# Patient Record
Sex: Male | Born: 1990 | Race: Black or African American | Hispanic: No | Marital: Single | Smoking: Current every day smoker
Health system: Southern US, Community
[De-identification: ages and names within clinical notes are randomized; demographics above are authoritative.]

---
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
## 2019-08-30 ENCOUNTER — Emergency Department (HOSPITAL_BASED_OUTPATIENT_CLINIC_OR_DEPARTMENT_OTHER): Payer: Self-pay

## 2019-08-30 ENCOUNTER — Encounter (HOSPITAL_BASED_OUTPATIENT_CLINIC_OR_DEPARTMENT_OTHER): Payer: Self-pay

## 2019-08-30 ENCOUNTER — Emergency Department (HOSPITAL_BASED_OUTPATIENT_CLINIC_OR_DEPARTMENT_OTHER)
Admission: EM | Admit: 2019-08-30 | Discharge: 2019-08-30 | Disposition: A | Discharge status: Home / Self Care | Payer: Self-pay | Attending: Emergency Medicine | Admitting: Emergency Medicine

## 2019-08-30 ENCOUNTER — Other Ambulatory Visit: Payer: Self-pay

## 2019-08-30 DIAGNOSIS — R509 Fever, unspecified: Secondary | ICD-10-CM | POA: Insufficient documentation

## 2019-08-30 DIAGNOSIS — F17210 Nicotine dependence, cigarettes, uncomplicated: Secondary | ICD-10-CM | POA: Insufficient documentation

## 2019-08-30 DIAGNOSIS — Z20822 Contact with and (suspected) exposure to covid-19: Secondary | ICD-10-CM | POA: Insufficient documentation

## 2019-08-30 DIAGNOSIS — R1030 Lower abdominal pain, unspecified: Secondary | ICD-10-CM | POA: Insufficient documentation

## 2019-08-30 DIAGNOSIS — R112 Nausea with vomiting, unspecified: Secondary | ICD-10-CM | POA: Insufficient documentation

## 2019-08-30 DIAGNOSIS — R197 Diarrhea, unspecified: Secondary | ICD-10-CM | POA: Insufficient documentation

## 2019-08-30 DIAGNOSIS — R05 Cough: Secondary | ICD-10-CM | POA: Insufficient documentation

## 2019-08-30 LAB — CBC WITH DIFFERENTIAL/PLATELET
Abs Immature Granulocytes: 0.07 10*3/uL (ref 0.00–0.07)
Basophils Absolute: 0 10*3/uL (ref 0.0–0.1)
Basophils Relative: 0 %
Eosinophils Absolute: 0.1 10*3/uL (ref 0.0–0.5)
Eosinophils Relative: 1 %
HCT: 51.2 % (ref 39.0–52.0)
Hemoglobin: 16.5 g/dL (ref 13.0–17.0)
Immature Granulocytes: 1 %
Lymphocytes Relative: 16 %
Lymphs Abs: 1.9 10*3/uL (ref 0.7–4.0)
MCH: 29.5 pg (ref 26.0–34.0)
MCHC: 32.2 g/dL (ref 30.0–36.0)
MCV: 91.6 fL (ref 80.0–100.0)
Monocytes Absolute: 0.5 10*3/uL (ref 0.1–1.0)
Monocytes Relative: 4 %
Neutro Abs: 9.5 10*3/uL — ABNORMAL HIGH (ref 1.7–7.7)
Neutrophils Relative %: 78 %
Platelets: 308 10*3/uL (ref 150–400)
RBC: 5.59 MIL/uL (ref 4.22–5.81)
RDW: 14.5 % (ref 11.5–15.5)
WBC: 12.2 10*3/uL — ABNORMAL HIGH (ref 4.0–10.5)
nRBC: 0 % (ref 0.0–0.2)

## 2019-08-30 LAB — URINALYSIS, ROUTINE W REFLEX MICROSCOPIC
Bilirubin Urine: NEGATIVE
Glucose, UA: NEGATIVE mg/dL
Hgb urine dipstick: NEGATIVE
Ketones, ur: NEGATIVE mg/dL
Leukocytes,Ua: NEGATIVE
Nitrite: NEGATIVE
Protein, ur: NEGATIVE mg/dL
Specific Gravity, Urine: 1.01 (ref 1.005–1.030)
pH: 6.5 (ref 5.0–8.0)

## 2019-08-30 LAB — COMPREHENSIVE METABOLIC PANEL
ALT: 25 U/L (ref 0–44)
AST: 27 U/L (ref 15–41)
Albumin: 4.7 g/dL (ref 3.5–5.0)
Alkaline Phosphatase: 88 U/L (ref 38–126)
Anion gap: 12 (ref 5–15)
BUN: 11 mg/dL (ref 6–20)
CO2: 24 mmol/L (ref 22–32)
Calcium: 9.9 mg/dL (ref 8.9–10.3)
Chloride: 102 mmol/L (ref 98–111)
Creatinine, Ser: 1.04 mg/dL (ref 0.61–1.24)
GFR calc Af Amer: >60 mL/min (ref >60)
GFR calc non Af Amer: >60 mL/min (ref >60)
Glucose, Bld: 93 mg/dL (ref 70–99)
Potassium: 3.8 mmol/L (ref 3.5–5.1)
Sodium: 138 mmol/L (ref 135–145)
Total Bilirubin: 0.5 mg/dL (ref 0.3–1.2)
Total Protein: 7.9 g/dL (ref 6.5–8.1)

## 2019-08-30 LAB — RAPID URINE DRUG SCREEN, HOSP PERFORMED
Amphetamines: NOT DETECTED
Barbiturates: NOT DETECTED
Benzodiazepines: NOT DETECTED
Cocaine: POSITIVE — AB
Opiates: NOT DETECTED
Tetrahydrocannabinol: POSITIVE — AB

## 2019-08-30 LAB — SARS CORONAVIRUS 2 BY RT PCR (HOSPITAL ORDER, PERFORMED IN ~~LOC~~ HOSPITAL LAB): SARS Coronavirus 2: NEGATIVE

## 2019-08-30 LAB — LIPASE, BLOOD: Lipase: 30 U/L (ref 11–51)

## 2019-08-30 MED ORDER — KETOROLAC TROMETHAMINE 15 MG/ML IJ SOLN: 15.0000 mg | Freq: Once | INTRAMUSCULAR | Status: DC

## 2019-08-30 MED ORDER — PROMETHAZINE HCL 25 MG/ML IJ SOLN
INTRAMUSCULAR | Status: AC
  Administered 2019-08-30: 25 mg via INTRAVENOUS
  Filled 2019-08-30: qty 1

## 2019-08-30 MED ORDER — KETOROLAC TROMETHAMINE 30 MG/ML IJ SOLN
30.0000 mg | Freq: Once | INTRAMUSCULAR | Status: AC
  Administered 2019-08-30: 30 mg via INTRAVENOUS
  Filled 2019-08-30: qty 1

## 2019-08-30 MED ORDER — SODIUM CHLORIDE 0.9 % IV BOLUS
1000.0000 mL | Freq: Once | INTRAVENOUS | Status: AC
  Administered 2019-08-30: 1000 mL via INTRAVENOUS

## 2019-08-30 MED ORDER — ONDANSETRON 4 MG PO TBDP: 4.0000 mg | ORAL_TABLET | Freq: Three times a day (TID) | ORAL | 0 refills | Status: AC | PRN

## 2019-08-30 MED ORDER — HALOPERIDOL LACTATE 5 MG/ML IJ SOLN
2.0000 mg | Freq: Once | INTRAMUSCULAR | Status: AC
  Administered 2019-08-30: 2 mg via INTRAVENOUS
  Filled 2019-08-30: qty 1

## 2019-08-30 MED ORDER — ONDANSETRON HCL 4 MG/2ML IJ SOLN
4.0000 mg | Freq: Once | INTRAMUSCULAR | Status: AC
  Administered 2019-08-30: 4 mg via INTRAVENOUS
  Filled 2019-08-30: qty 2

## 2019-08-30 MED ORDER — PROMETHAZINE HCL 25 MG/ML IJ SOLN: 25.0000 mg | Freq: Once | INTRAMUSCULAR | Status: AC

## 2019-08-30 MED ORDER — IOHEXOL 300 MG/ML  SOLN
100.0000 mL | Freq: Once | INTRAMUSCULAR | Status: AC | PRN
  Administered 2019-08-30: 100 mL via INTRAVENOUS

## 2019-08-30 NOTE — ED Provider Notes (Signed)
[facility] EMERGENCY DEPARTMENT Provider Note   CSN: [id number] Arrival date & time: 08/30/19  1021     History Chief Complaint  Patient presents with   Emesis    Tim Nash is a 29 y.o. male who presents for evaluation of 4 days of nausea/vomiting, body aches.  He states he has had some subjective fever/chills but has not measured any temperature.  He states that over the last 4 days, he has not really been able to tolerate much p.o.  He states he has had multiple episodes of nonbloody, nonbilious vomiting.  He also reports occasional episodes of diarrhea.  He states that he has had some lower abdominal pain that has been ongoing for the same amount of time.  He states he has had some slight cough but states it is not productive.  He feels like sometimes, he has to take deep breaths.  He reports that his uncle was recently diagnosed with Covid and is now in the hospital.  He last saw his uncle about 4 days ago.  He also reports his girlfriend had someone at work who tested positive.  He did not get vaccinated.  He denies any chest pain, difficulty urinating, dysuria, hematuria.  He smokes half pack cigarettes a day.  The history is provided by the patient.       History reviewed. No pertinent past medical history.  There are no problems to display for this patient.   History reviewed. No pertinent surgical history.     No family history on file.  Social History   Tobacco Use   Smoking status: Current Every Day Smoker    Packs/day: 0.50    Types: Cigarettes   Smokeless tobacco: Never Used  Vaping Use   Vaping Use: Never used  Substance Use Topics   Alcohol use: Never   Drug use: Yes    Types: Marijuana    Home Medications Prior to Admission medications   Medication Sig Start Date End Date Taking? Authorizing Provider  ondansetron (ZOFRAN ODT) 4 MG disintegrating tablet Take 1 tablet (4 mg total) by mouth every 8 (eight) hours as needed for  nausea or vomiting. 08/30/19   Maxwell Caul, PA-C    Allergies    Patient has no known allergies.  Review of Systems   Review of Systems  Constitutional: Positive for chills and fever.  HENT: Negative for congestion.   Respiratory: Positive for cough. Negative for shortness of breath.   Cardiovascular: Negative for chest pain.  Gastrointestinal: Positive for abdominal pain, diarrhea, nausea and vomiting.  Musculoskeletal: Negative for back pain and neck pain.  Skin: Negative for rash.  Neurological: Negative for dizziness, weakness, numbness and headaches.  Psychiatric/Behavioral: Negative for confusion.  All other systems reviewed and are negative.   Physical Exam Updated Vital Signs BP (!) 155/89    Pulse 83    Temp 98.7 F (37.1 C) (Oral)    Resp (!) 22    Ht 5\' 8"  (1.727 m)    Wt 89.8 kg    SpO2 94%    BMI 30.11 kg/m   Physical Exam Vitals and nursing note reviewed.  Constitutional:      Appearance: Normal appearance. He is well-developed. He is diaphoretic.     Comments: Actively vomiting.  Appears uncomfortable.  Diaphoretic noted.  HENT:     Head: Normocephalic and atraumatic.  Eyes:     General: Lids are normal.     Conjunctiva/sclera: Conjunctivae normal.  Pupils: Pupils are equal, round, and reactive to light.  Cardiovascular:     Rate and Rhythm: Normal rate and regular rhythm.     Pulses: Normal pulses.     Heart sounds: Normal heart sounds. No murmur heard.  No friction rub. No gallop.   Pulmonary:     Effort: Pulmonary effort is normal.     Breath sounds: Normal breath sounds.     Comments: Lungs clear to auscultation bilaterally.  Symmetric chest rise.  No wheezing, rales, rhonchi.  Abdominal:     Palpations: Abdomen is soft. Abdomen is not rigid.     Tenderness: There is abdominal tenderness in the right lower quadrant, suprapubic area and left lower quadrant. There is no guarding.     Comments: Abdomen is soft, non-distended.  No rigidity,  guarding.  No CVA tenderness noted bilaterally.  Diffuse tenderness noted to the lower abdomen, particularly the right lower and left lower quadrant.  Negative rebounding.  Musculoskeletal:        General: Normal range of motion.     Cervical back: Full passive range of motion without pain.  Skin:    General: Skin is warm.     Capillary Refill: Capillary refill takes less than 2 seconds.  Neurological:     Mental Status: He is alert and oriented to person, place, and time.  Psychiatric:        Speech: Speech normal.     ED Results / Procedures / Treatments   Labs (all labs ordered are listed, but only abnormal results are displayed) Labs Reviewed  CBC WITH DIFFERENTIAL/PLATELET - Abnormal; Notable for the following components:      Result Value   WBC 12.2 (*)    Neutro Abs 9.5 (*)    All other components within normal limits  RAPID URINE DRUG SCREEN, HOSP PERFORMED - Abnormal; Notable for the following components:   Cocaine POSITIVE (*)    Tetrahydrocannabinol POSITIVE (*)    All other components within normal limits  SARS CORONAVIRUS 2 BY RT PCR (HOSPITAL ORDER, PERFORMED IN [facility])  COMPREHENSIVE METABOLIC PANEL  LIPASE, BLOOD  URINALYSIS, ROUTINE W REFLEX MICROSCOPIC    EKG None  Radiology CT ABDOMEN PELVIS W CONTRAST  Result Date: 08/30/2019 CLINICAL DATA:  Abdominal pain EXAM: CT ABDOMEN AND PELVIS WITH CONTRAST TECHNIQUE: Multidetector CT imaging of the abdomen and pelvis was performed using the standard protocol following bolus administration of intravenous contrast. CONTRAST:  OMNIPAQUE IOHEXOL 300 MG/ML  SOLN COMPARISON:  None. FINDINGS: Lower chest: Gynectomastia, mild. Mild bibasilar atelectasis. No pleural or pericardial effusion. Hepatobiliary: Focal fatty deposition adjacent to the falciform ligament. Hepatic and portal veins are patent. Gallbladder is unremarkable. No intrahepatic or extrahepatic biliary ductal dilation. Pancreas:  Unremarkable. No pancreatic ductal dilatation or surrounding inflammatory changes. Spleen: Normal in size without focal abnormality. Adrenals/Urinary Tract: Adrenal glands are unremarkable. Kidneys are normal, without renal calculi, focal lesion, or hydronephrosis. Bladder is unremarkable. Stomach/Bowel: Small hiatal hernia. No evidence of bowel obstruction. There is mild bowel wall thickening throughout the colon with adjacent faint fat stranding. This is most predominant in the ascending colon and descending colon near the sigmoid. The appendix is unremarkable. Terminal ileum is unremarkable. Vascular/Lymphatic: No significant vascular findings are present. No enlarged abdominal or pelvic lymph nodes. Reproductive: Prostate is unremarkable. Other: No abdominal wall hernia or abnormality. No abdominopelvic ascites. Musculoskeletal: No acute or significant osseous findings. IMPRESSION: 1. Mild bowel wall thickening throughout the colon with adjacent faint fat  stranding. This is most predominant in the ascending colon and descending colon near the sigmoid. Findings are suggestive of an infectious or inflammatory colitis. Electronically Signed   By: Meda Klinefelter MD   On: 08/30/2019 13:31    Procedures Procedures (including critical care time)  Medications Ordered in ED Medications  ondansetron [facility]) injection 4 mg (4 mg Intravenous Given 08/30/19 1121)  sodium chloride 0.9 % bolus 1,000 mL (0 mLs Intravenous Stopped 08/30/19 1239)  promethazine (PHENERGAN) injection 25 mg (25 mg Intravenous Given 08/30/19 1225)  iohexol (OMNIPAQUE) 300 MG/ML solution 100 mL (100 mLs Intravenous Contrast Given 08/30/19 1303)  ketorolac (TORADOL) 30 MG/ML injection 30 mg (30 mg Intravenous Given 08/30/19 1418)  haloperidol lactate (HALDOL) injection 2 mg (2 mg Intravenous Given 08/30/19 1557)    ED Course  I have reviewed the triage vital signs and the nursing notes.  Pertinent labs & imaging results that were  available during my care of the patient were reviewed by me and considered in my medical decision making (see chart for details).    MDM Rules/Calculators/A&P                          29 year old male who presents for evaluation of 4 days of nausea/vomiting, generalized body aches, subjective fever/chills.  Also reports occasional diarrhea.  Reports recent COVID-19 exposure.  He is not vaccinated.  On initially arrival, he is afebrile.  He appears uncomfortable but no acute distress.  Vital signs are stable.  On exam, no evidence of respiratory distress.  He does have some diffuse lower abdominal tenderness.  Concern for COVID-19 infection versus viral GI process.  Low suspicion for appendicitis but he does not have any focal tenderness noted at McBurney's point.  We will plan to check labs, COVID-19 test.  Covid is negative.  Lipase is normal.  CMP shows normal BUN and creatinine.  CBC shows slight leukocytosis of 12.2.  Patient still having nausea/vomiting and unable to tolerate p.o.  Given leukocytosis as well as lower abdominal pain, will plan for CT for evaluation of any acute intra-abdominal infectious pathology such as appendicitis.  CT scan shows mild bowel wall thickening throughout the colon with adjacent faint fat stranding. Findings suggestive of infectious or inflammatory colitis.  Reevaluation.  Patient has not had any more vomiting.  He reports feeling better.  We will plan to p.o. challenge.  Patient failed p.o. challenge.  He started vomiting again.  We will plan to give him Haldol.  He does endorse using marijuana use yesterday.  His symptoms could be related to cannabinoid hyperemesis syndrome.  Reevaluation after Haldol.  Patient is resting comfortably in bed.  He states he feels better.  Repeat abdominal exam is improved.  He has not had any more vomiting.  Patient stable for discharge at this time.  I did discuss with patient to closely monitor his symptoms as his Covid test  could be false negative.  Will give short course of Zofran for nausea/vomiting relief. At this time, patient exhibits no emergent life-threatening condition that require further evaluation in ED or admission. Patient had ample opportunity for questions and discussion. All patient's questions were answered with full understanding. Strict return precautions discussed. Patient expresses understanding and agreement to plan.   Tim Nash was evaluated in Emergency Department on 08/30/2019 for the symptoms described in the history of present illness. He was evaluated in the context of the global COVID-19 pandemic, which necessitated consideration  that the patient might be at risk for infection with the SARS-CoV-2 virus that causes COVID-19. Institutional protocols and algorithms that pertain to the evaluation of patients at risk for COVID-19 are in a state of rapid change based on information released by regulatory bodies including the CDC and federal and state organizations. These policies and algorithms were followed during the patient's care in the ED.  Portions of this note were generated with Scientist, clinical (histocompatibility and immunogenetics)Dragon dictation software. Dictation errors may occur despite best attempts at proofreading.  Final Clinical Impression(s) / ED Diagnoses Final diagnoses:  Lower abdominal pain  Non-intractable vomiting with nausea, unspecified vomiting type    Rx / DC Orders ED Discharge Orders         Ordered    ondansetron (ZOFRAN ODT) 4 MG disintegrating tablet  Every 8 hours PRN     Discontinue  Reprint     08/30/19 1649           Maxwell CaulLayden, Santresa Levett A, PA-C 08/30/19 1734    Linwood DibblesKnapp, Jon, MD 08/31/19 [phone]

## 2019-08-30 NOTE — ED Notes (Signed)
Given Gatorade to drink  Pt states feels a lot better than when he came in

## 2019-08-30 NOTE — Discharge Instructions (Signed)
Take Zofran for nausea/vomiting.  Make sure you are drinking plenty of fluids and staying hydrated.  As we discussed, your Covid test was negative.  You should still refrain from a lot of contact for the next few days to ensure that your symptoms resolve completely.  Return the emergency department for any worsening abdominal pain, vomiting or any other worsening concerning symptoms.

## 2019-08-30 NOTE — ED Triage Notes (Signed)
Pt arrives with c/o vomiting with body aches and chills X3-4 days reports he has been around someone who is Covid positive.

## 2021-01-15 IMAGING — CT CT ABD-PELV W/ CM
2 of 4 series · 16 of 46 positions shown, 18 images · IV contrast (Omnipaque)
Comparison: None.

CLINICAL DATA: Abdominal pain

EXAM:
CT ABDOMEN AND PELVIS WITH CONTRAST
TECHNIQUE: Multidetector CT imaging of the abdomen and pelvis was performed
using the standard protocol following bolus administration of
intravenous contrast.
CONTRAST:  100mL OMNIPAQUE IOHEXOL 300 MG/ML  SOLN

[Series 2: axial st · axial · 0.98mm/px · z∈[-473,-8]mm · 13 of 103 slices shown, 15 images]
[im 5/103  soft-tissue]
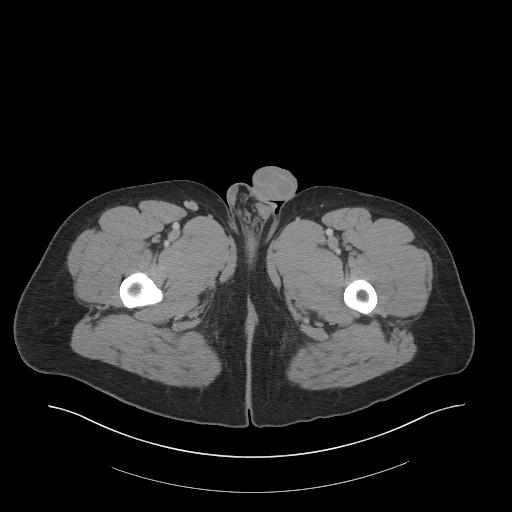
[im 5/103  bone]
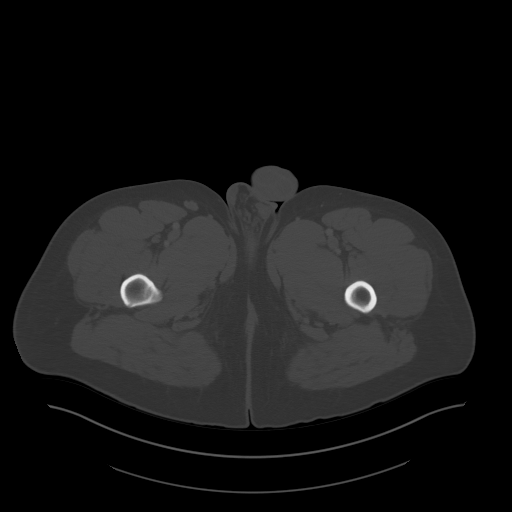
[im 13/103  soft-tissue]
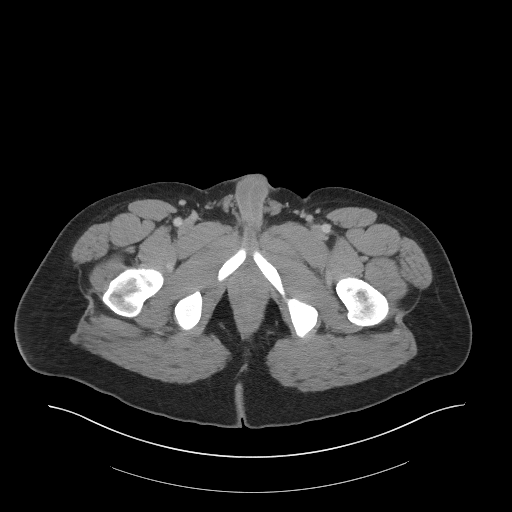
[im 21/103  soft-tissue]
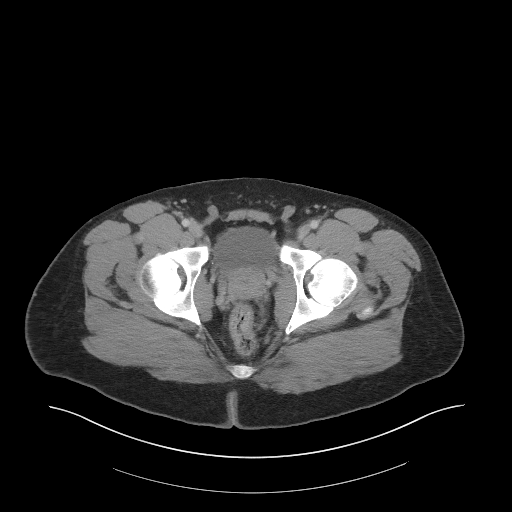
[im 29/103  soft-tissue]
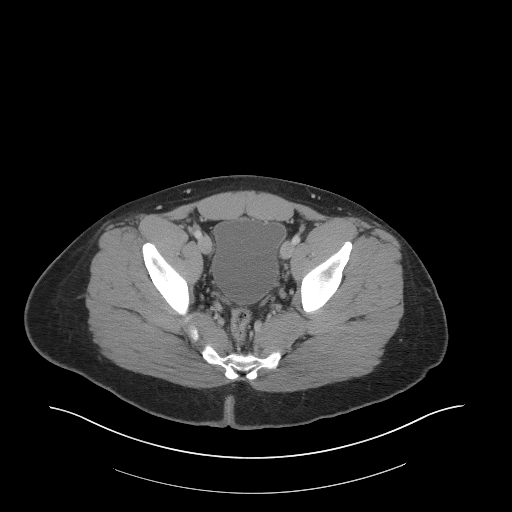
[im 37/103  soft-tissue]
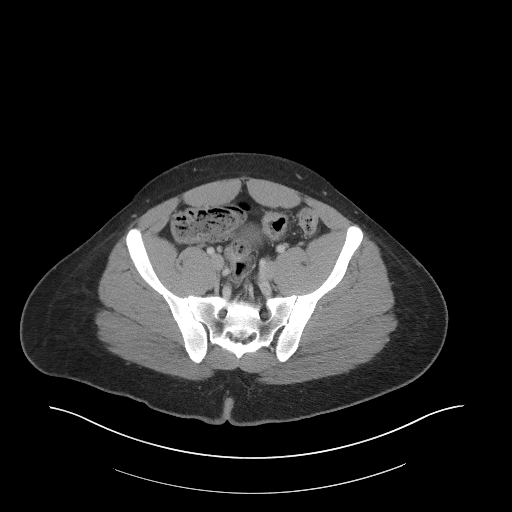
[im 45/103  soft-tissue]
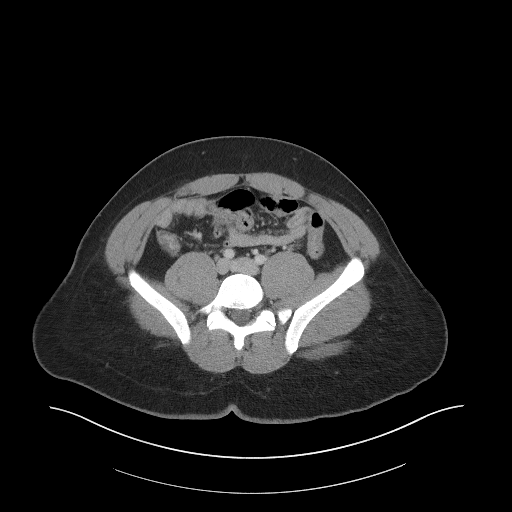
[im 54/103  soft-tissue]
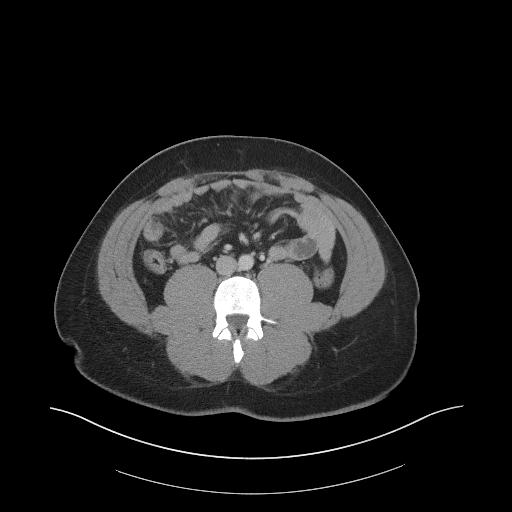
[im 58/103  soft-tissue]
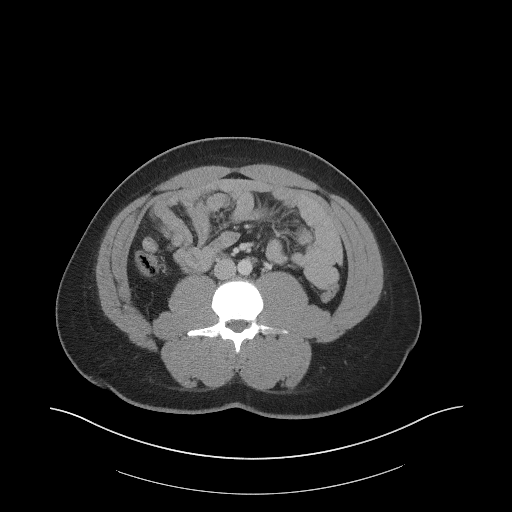
[im 66/103  soft-tissue]
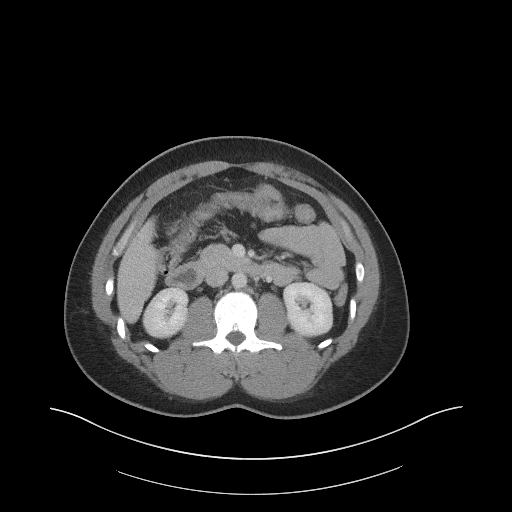
[im 66/103  bone]
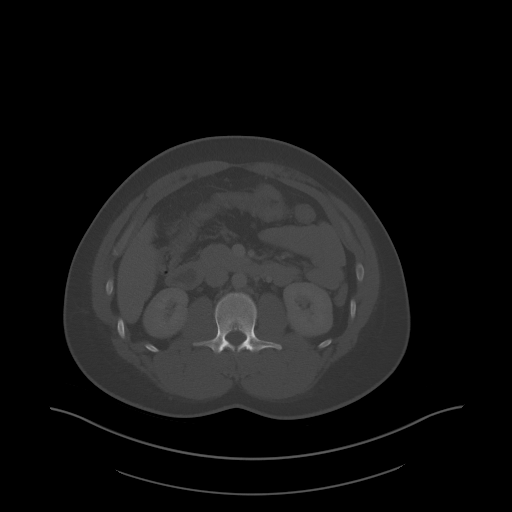
[im 74/103  soft-tissue]
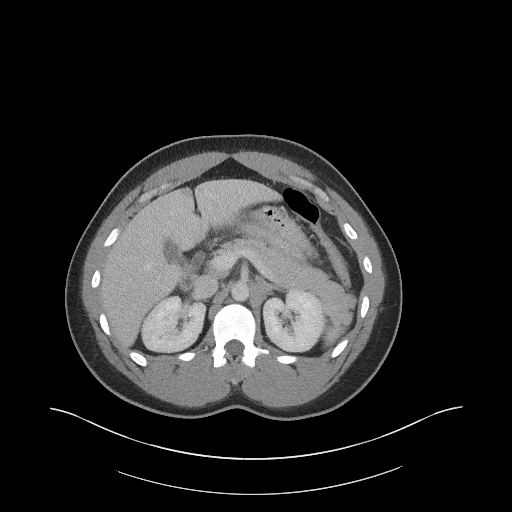
[im 82/103  soft-tissue]
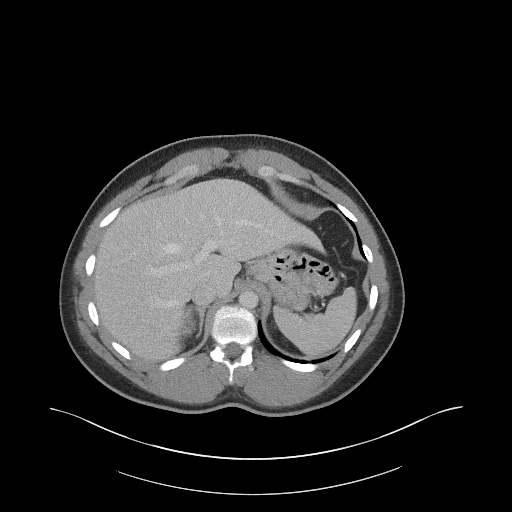
[im 90/103  soft-tissue]
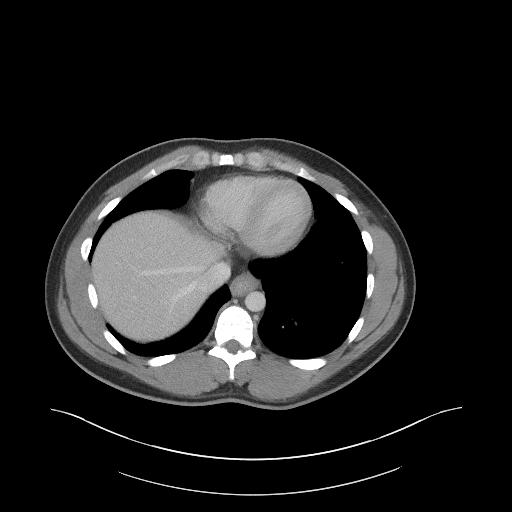
[im 98/103  soft-tissue]
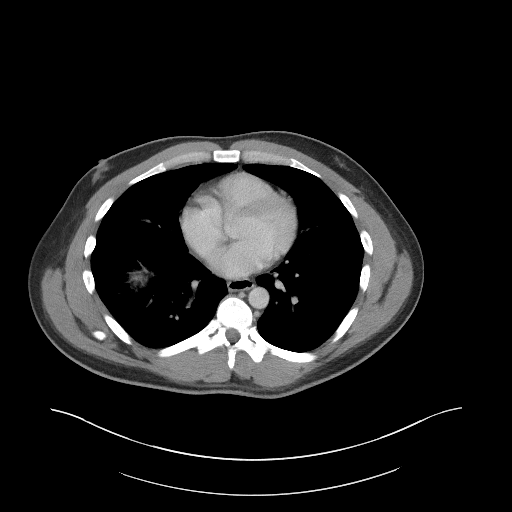

[Series 5: coronal st · coronal · 0.78mm/px · 3 of 96 slices shown]
[im 32/96  soft-tissue]
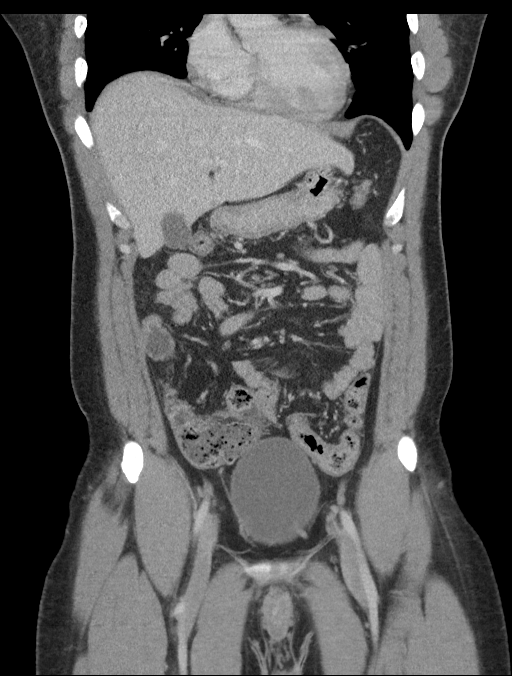
[im 43/96  soft-tissue]
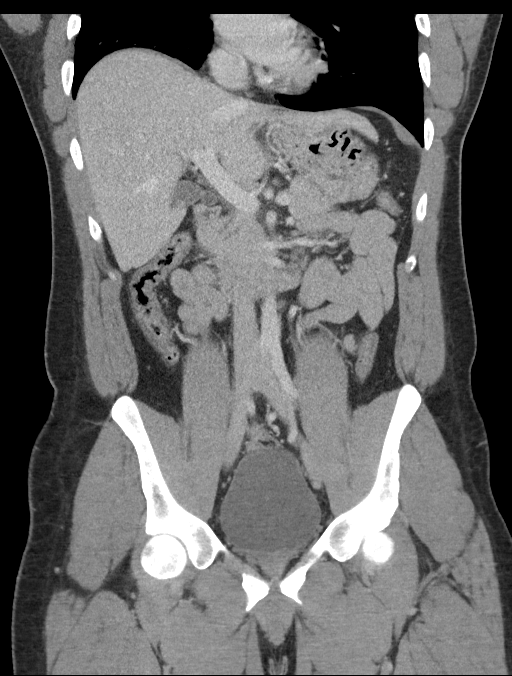
[im 53/96  soft-tissue]
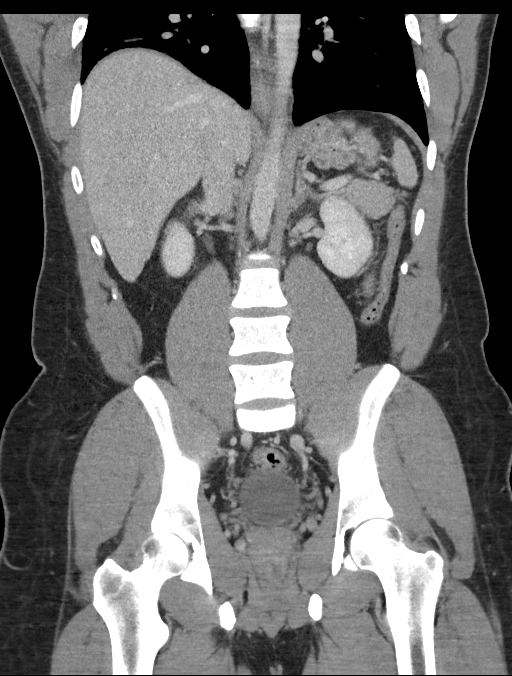

[16 of 46 positions shown; findings below may reference images not displayed]

FINDINGS: Lower chest: Gynectomastia, mild. Mild bibasilar atelectasis. No
pleural or pericardial effusion.

Hepatobiliary: Focal fatty deposition adjacent to the falciform
ligament. Hepatic and portal veins are patent. Gallbladder is
unremarkable. No intrahepatic or extrahepatic biliary ductal
dilation.

Pancreas: Unremarkable. No pancreatic ductal dilatation or
surrounding inflammatory changes.

Spleen: Normal in size without focal abnormality.

Adrenals/Urinary Tract: Adrenal glands are unremarkable. Kidneys are
normal, without renal calculi, focal lesion, or hydronephrosis.
Bladder is unremarkable.

Stomach/Bowel: Small hiatal hernia. No evidence of bowel
obstruction. There is mild bowel wall thickening throughout the
colon with adjacent faint fat stranding. This is most predominant in
the ascending colon and descending colon near the sigmoid. The
appendix is unremarkable. Terminal ileum is unremarkable.

Vascular/Lymphatic: No significant vascular findings are present. No
enlarged abdominal or pelvic lymph nodes.

Reproductive: Prostate is unremarkable.

Other: No abdominal wall hernia or abnormality. No abdominopelvic
ascites.

Musculoskeletal: No acute or significant osseous findings.
IMPRESSION: 1. Mild bowel wall thickening throughout the colon with adjacent
faint fat stranding. This is most predominant in the ascending colon
and descending colon near the sigmoid. Findings are suggestive of an
infectious or inflammatory colitis.

## 2023-02-21 ENCOUNTER — Emergency Department (HOSPITAL_COMMUNITY)

## 2023-02-21 ENCOUNTER — Emergency Department (HOSPITAL_COMMUNITY)
Admission: EM | Admit: 2023-02-21 | Discharge: 2023-02-21 | Attending: Emergency Medicine | Admitting: Emergency Medicine

## 2023-02-21 ENCOUNTER — Encounter (HOSPITAL_COMMUNITY): Payer: Self-pay | Admitting: Emergency Medicine

## 2023-02-21 ENCOUNTER — Other Ambulatory Visit: Payer: Self-pay

## 2023-02-21 DIAGNOSIS — F10920 Alcohol use, unspecified with intoxication, uncomplicated: Secondary | ICD-10-CM

## 2023-02-21 DIAGNOSIS — F10120 Alcohol abuse with intoxication, uncomplicated: Secondary | ICD-10-CM | POA: Insufficient documentation

## 2023-02-21 DIAGNOSIS — R410 Disorientation, unspecified: Secondary | ICD-10-CM | POA: Diagnosis present

## 2023-02-21 DIAGNOSIS — Y908 Blood alcohol level of 240 mg/100 ml or more: Secondary | ICD-10-CM | POA: Diagnosis not present

## 2023-02-21 LAB — CBC WITH DIFFERENTIAL/PLATELET
Abs Immature Granulocytes: 0.02 10*3/uL (ref 0.00–0.07)
Basophils Absolute: 0 10*3/uL (ref 0.0–0.1)
Basophils Relative: 0 %
Eosinophils Absolute: 0.1 10*3/uL (ref 0.0–0.5)
Eosinophils Relative: 1 %
HCT: 43.3 % (ref 39.0–52.0)
Hemoglobin: 13.9 g/dL (ref 13.0–17.0)
Immature Granulocytes: 0 %
Lymphocytes Relative: 43 %
Lymphs Abs: 2.1 10*3/uL (ref 0.7–4.0)
MCH: 27.4 pg (ref 26.0–34.0)
MCHC: 32.1 g/dL (ref 30.0–36.0)
MCV: 85.4 fL (ref 80.0–100.0)
Monocytes Absolute: 0.3 10*3/uL (ref 0.1–1.0)
Monocytes Relative: 7 %
Neutro Abs: 2.3 10*3/uL (ref 1.7–7.7)
Neutrophils Relative %: 49 %
Platelets: 308 10*3/uL (ref 150–400)
RBC: 5.07 MIL/uL (ref 4.22–5.81)
RDW: 13.8 % (ref 11.5–15.5)
WBC: 4.8 10*3/uL (ref 4.0–10.5)
nRBC: 0 % (ref 0.0–0.2)

## 2023-02-21 LAB — CBG MONITORING, ED: Glucose-Capillary: 102 mg/dL — ABNORMAL HIGH (ref 70–99)

## 2023-02-21 LAB — COMPREHENSIVE METABOLIC PANEL
ALT: 22 U/L (ref 0–44)
AST: 33 U/L (ref 15–41)
Albumin: 4.2 g/dL (ref 3.5–5.0)
Alkaline Phosphatase: 80 U/L (ref 38–126)
Anion gap: 13 (ref 5–15)
BUN: 14 mg/dL (ref 6–20)
CO2: 20 mmol/L — ABNORMAL LOW (ref 22–32)
Calcium: 8.8 mg/dL — ABNORMAL LOW (ref 8.9–10.3)
Chloride: 107 mmol/L (ref 98–111)
Creatinine, Ser: 1.35 mg/dL — ABNORMAL HIGH (ref 0.61–1.24)
GFR, Estimated: >60 mL/min (ref >60)
Glucose, Bld: 106 mg/dL — ABNORMAL HIGH (ref 70–99)
Potassium: 3.7 mmol/L (ref 3.5–5.1)
Sodium: 140 mmol/L (ref 135–145)
Total Bilirubin: 0.4 mg/dL (ref 0.0–1.2)
Total Protein: 7.4 g/dL (ref 6.5–8.1)

## 2023-02-21 LAB — ACETAMINOPHEN LEVEL: Acetaminophen (Tylenol), Serum: <10 ug/mL — ABNORMAL LOW (ref 10–30)

## 2023-02-21 LAB — MAGNESIUM: Magnesium: 2.2 mg/dL (ref 1.7–2.4)

## 2023-02-21 LAB — ETHANOL: Alcohol, Ethyl (B): 277 mg/dL — ABNORMAL HIGH (ref <10)

## 2023-02-21 LAB — SALICYLATE LEVEL: Salicylate Lvl: <7 mg/dL — ABNORMAL LOW (ref 7.0–30.0)

## 2023-02-21 MED ORDER — ZIPRASIDONE MESYLATE 20 MG IM SOLR
20.0000 mg | Freq: Once | INTRAMUSCULAR | Status: AC
Start: 2023-02-21 — End: 2023-02-21
  Administered 2023-02-21: 20 mg via INTRAMUSCULAR
  Filled 2023-02-21: qty 20

## 2023-02-21 MED ORDER — LACTATED RINGERS IV BOLUS
1000.0000 mL | Freq: Once | INTRAVENOUS | Status: AC
  Administered 2023-02-21: 1000 mL via INTRAVENOUS

## 2023-02-21 NOTE — ED Triage Notes (Signed)
Patient brought in from jail for consuming an unknown amount of hand sanitizer approx 1 hour ago. Police reports patient was confused at jail.

## 2023-02-21 NOTE — ED Provider Notes (Signed)
 [place] EMERGENCY DEPARTMENT AT [facility] Provider Note   CSN: [id number] Arrival date & time: 02/21/23  0017     History  Chief Complaint  Patient presents with   Ingestion    Tim Nash is a 33 y.o. male.   Ingestion Pertinent negatives include no chest pain and no shortness of breath.  Patient presents for ingestion.  He arrives from jail.  Approximately 1.5 hours prior to arrival, he drank an unknown amount of hand sanitizer.  Sheriff describes the size of the bottle as possibly half liter.  He has had some confusion since then.  Patient, himself, denies any areas of discomfort.  He states that he feels dirty.     Home Medications Prior to Admission medications   Medication Sig Start Date End Date Taking? Authorizing Provider  ondansetron  (ZOFRAN  ODT) 4 MG disintegrating tablet Take 1 tablet (4 mg total) by mouth every 8 (eight) hours as needed for nausea or vomiting. 08/30/19   Delorise Morna LABOR, PA-C      Allergies    Patient has no known allergies.    Review of Systems   Review of Systems  Respiratory:  Negative for shortness of breath.   Cardiovascular:  Negative for chest pain.  Gastrointestinal:  Negative for nausea.  All other systems reviewed and are negative.   Physical Exam Updated Vital Signs BP 111/85   Pulse 81   Temp 98 F (36.7 C) (Oral)   Resp 20   Ht 5' 8 (1.727 m)   Wt 90.7 kg   SpO2 95%   BMI 30.41 kg/m  Physical Exam Vitals and nursing note reviewed.  Constitutional:      General: He is not in acute distress.    Appearance: Normal appearance. He is well-developed. He is not ill-appearing, toxic-appearing or diaphoretic.  HENT:     Head: Normocephalic and atraumatic.     Right Ear: External ear normal.     Left Ear: External ear normal.     Nose: Nose normal.     Mouth/Throat:     Mouth: Mucous membranes are moist.  Eyes:     Extraocular Movements: Extraocular movements intact.     Conjunctiva/sclera:  Conjunctivae normal.  Cardiovascular:     Rate and Rhythm: Normal rate and regular rhythm.     Heart sounds: No murmur heard. Pulmonary:     Effort: Pulmonary effort is normal. No respiratory distress.     Breath sounds: Normal breath sounds. No wheezing, rhonchi or rales.  Chest:     Chest wall: No tenderness.  Abdominal:     General: There is no distension.     Palpations: Abdomen is soft.     Tenderness: There is no abdominal tenderness.  Musculoskeletal:        General: No swelling. Normal range of motion.     Cervical back: Normal range of motion and neck supple.  Skin:    General: Skin is warm and dry.     Coloration: Skin is not jaundiced or pale.  Neurological:     General: No focal deficit present.     Mental Status: He is alert. He is disoriented.  Psychiatric:        Mood and Affect: Mood normal.        Behavior: Behavior normal.     ED Results / Procedures / Treatments   Labs (all labs ordered are listed, but only abnormal results are displayed) Labs Reviewed  COMPREHENSIVE METABOLIC PANEL -  Abnormal; Notable for the following components:      Result Value   CO2 20 (*)    Glucose, Bld 106 (*)    Creatinine, Ser 1.35 (*)    Calcium 8.8 (*)    All other components within normal limits  SALICYLATE LEVEL - Abnormal; Notable for the following components:   Salicylate Lvl <7.0 (*)    All other components within normal limits  ACETAMINOPHEN  LEVEL - Abnormal; Notable for the following components:   Acetaminophen  (Tylenol ), Serum <10 (*)    All other components within normal limits  ETHANOL - Abnormal; Notable for the following components:   Alcohol, Ethyl (B) 277 (*)    All other components within normal limits  CBG MONITORING, ED - Abnormal; Notable for the following components:   Glucose-Capillary 102 (*)    All other components within normal limits  CBC WITH DIFFERENTIAL/PLATELET  MAGNESIUM  RAPID URINE DRUG SCREEN, HOSP PERFORMED    EKG EKG  Interpretation Date/Time:  Tuesday February 21 2023 00:27:10 EST Ventricular Rate:  95 PR Interval:  137 QRS Duration:  103 QT Interval:  343 QTC Calculation: 432 R Axis:   -41  Text Interpretation: Sinus rhythm Left axis deviation Right ventricular hypertrophy No significant change was found Confirmed by Carita Senior [phone]) on 02/21/2023 12:31:39 AM  Radiology DG Chest Port 1 View Result Date: 02/21/2023 CLINICAL DATA:  The patient ingested unknown amount of hand sanitizer. EXAM: PORTABLE CHEST 1 VIEW COMPARISON:  None Available. FINDINGS: The lungs are expiratory. There is linear atelectasis and bronchovascular crowding in the bases. The aerated lungs are clear but the lower lung fields are not well seen. The cardiomediastinal silhouette and vasculature are normal. There is a slight thoracic levoscoliosis.  Thoracic cage is intact. IMPRESSION: Expiratory chest with linear atelectasis and bronchovascular crowding in the bases. No evidence of acute chest disease, but the lower lung fields are not well seen. Electronically Signed   By: Francis Quam M.D.   On: 02/21/2023 02:33    Procedures Procedures    Medications Ordered in ED Medications  lactated ringers  bolus 1,000 mL (1,000 mLs Intravenous New Bag/Given 02/21/23 0145)  ziprasidone  (GEODON ) injection 20 mg (20 mg Intramuscular Given 02/21/23 0044)    ED Course/ Medical Decision Making/ A&P                                 Medical Decision Making Amount and/or Complexity of Data Reviewed Labs: ordered. Radiology: ordered.  Risk Prescription drug management.   This patient presents to the ED for concern of ingestion, this involves an extensive number of treatment options, and is a complaint that carries with it a high risk of complications and morbidity.  The differential diagnosis includes intoxication, caustic ingestion, esophagitis, gastritis, metabolic derangements   Co morbidities that complicate the patient  evaluation  N/A   Additional history obtained:  Additional history obtained from police officer External records from outside source obtained and reviewed including EMR   Lab Tests:  I Ordered, and personally interpreted labs.  The pertinent results include: Creatinine mildly increased from lab work from 3 years ago.  No leukocytosis is present.  Electrolytes are normal.  Ethanol level is elevated at 277.   Imaging Studies ordered:  I ordered imaging studies including chest x-ray I independently visualized and interpreted imaging which showed no acute findings I agree with the radiologist interpretation   Cardiac Monitoring: / EKG:  The patient was maintained on a cardiac monitor.  I personally viewed and interpreted the cardiac monitored which showed an underlying rhythm of: Sinus rhythm   Consultations Obtained:  I requested consultation with the poison control,  and discussed lab and imaging findings as well as pertinent plan - they recommend: Treat as ethanol intoxication.  No additional observation needed   Problem List / ED Course / Critical interventions / Medication management  Patient presents from jail after ingestion of hand sanitizer.  On arrival in the ED, he seems mildly intoxicated.  He is not willing or able to recall the events of his ingestion.  He denies any current physical symptoms.  His breathing is unlabored.  Lungs are clear to auscultation.  Patient was placed on bedside cardiac monitor.  Workup was initiated.  Patient had aggressive behavior towards ED staff.  He was given Geodon .  Lab work is notable for elevated ethanol level.  I spoke with poison control who suspects ethanol containing hand sanitizer.  They state that these sanitizers typically do not contain any other concerning ingredients.  They do not recommend any observation other than what would typically be done for alcohol intoxication.  On reassessment, patient is awake.  Sheriff's officers do  feel comfortable with taking him back to jail.  Patient was discharged in stable condition. I ordered medication including IV fluids for hydration; Geodon  for aggressive behavior. Reevaluation of the patient after these medicines showed that the patient improved I have reviewed the patients home medicines and have made adjustments as needed   Social Determinants of Health:  Currently incarcerated        Final Clinical Impression(s) / ED Diagnoses Final diagnoses:  Alcoholic intoxication without complication [facility])    Rx / DC Orders ED Discharge Orders     None         Melvenia Motto, MD 02/21/23 [phone]

## 2023-02-21 NOTE — Discharge Instructions (Signed)
Test results today are reassuring.  Change in mental status appears to be due to alcohol intoxication.  Return to the emergency department for any new or worsening symptoms of concern.

## 2023-02-21 NOTE — ED Notes (Signed)
This RN verified with EDP Dr. Durwin Nora that he had contacted Poison Control earlier this evening.
